# Patient Record
Sex: Male | Born: 1985 | Race: Black or African American | Hispanic: No | Marital: Single | Smoking: Never smoker
Health system: Southern US, Community
[De-identification: ages and names within clinical notes are randomized; demographics above are authoritative.]

## PROBLEM LIST (undated history)

## (undated) DIAGNOSIS — G4730 Sleep apnea, unspecified: Secondary | ICD-10-CM

## (undated) DIAGNOSIS — I10 Essential (primary) hypertension: Secondary | ICD-10-CM

---

## 2017-07-09 ENCOUNTER — Observation Stay
Admission: EM | Admit: 2017-07-09 | Discharge: 2017-07-09 | Disposition: A | Discharge status: Home / Self Care | Payer: Self-pay | Attending: Internal Medicine | Admitting: Internal Medicine

## 2017-07-09 ENCOUNTER — Emergency Department: Payer: Self-pay

## 2017-07-09 DIAGNOSIS — R0602 Shortness of breath: Secondary | ICD-10-CM

## 2017-07-09 DIAGNOSIS — I161 Hypertensive emergency: Secondary | ICD-10-CM

## 2017-07-09 DIAGNOSIS — S37009A Unspecified injury of unspecified kidney, initial encounter: Secondary | ICD-10-CM | POA: Insufficient documentation

## 2017-07-09 DIAGNOSIS — I509 Heart failure, unspecified: Secondary | ICD-10-CM | POA: Diagnosis present

## 2017-07-09 DIAGNOSIS — Z6841 Body Mass Index (BMI) 40.0 and over, adult: Secondary | ICD-10-CM | POA: Insufficient documentation

## 2017-07-09 DIAGNOSIS — I5031 Acute diastolic (congestive) heart failure: Secondary | ICD-10-CM | POA: Insufficient documentation

## 2017-07-09 DIAGNOSIS — R778 Other specified abnormalities of plasma proteins: Secondary | ICD-10-CM

## 2017-07-09 DIAGNOSIS — I110 Hypertensive heart disease with heart failure: Principal | ICD-10-CM | POA: Insufficient documentation

## 2017-07-09 DIAGNOSIS — N179 Acute kidney failure, unspecified: Secondary | ICD-10-CM

## 2017-07-09 DIAGNOSIS — I10 Essential (primary) hypertension: Secondary | ICD-10-CM | POA: Diagnosis present

## 2017-07-09 DIAGNOSIS — G4733 Obstructive sleep apnea (adult) (pediatric): Secondary | ICD-10-CM | POA: Diagnosis present

## 2017-07-09 DIAGNOSIS — I160 Hypertensive urgency: Secondary | ICD-10-CM | POA: Diagnosis present

## 2017-07-09 HISTORY — DX: Sleep apnea, unspecified: G47.30

## 2017-07-09 HISTORY — DX: Essential (primary) hypertension: I10

## 2017-07-09 LAB — B-TYPE NATRIURETIC PEPTIDE: B-Natriuretic Peptide: 679.2 pg/mL — ABNORMAL HIGH (ref 0.0–100.0)

## 2017-07-09 LAB — TROPONIN I
Troponin I: 0.03 ng/mL — ABNORMAL HIGH (ref 0.00–0.02)
Troponin I: 0.03 ng/mL — ABNORMAL HIGH (ref 0.00–0.02)
Troponin I: 0.02 ng/mL (ref 0.00–0.02)

## 2017-07-09 LAB — COMPREHENSIVE METABOLIC PANEL
ALT: 32 U/L (ref 0–55)
AST (SGOT): 46 U/L — ABNORMAL HIGH (ref 10–42)
Albumin/Globulin Ratio: 0.89 Ratio (ref 0.80–2.00)
Albumin: 3.4 gm/dL — ABNORMAL LOW (ref 3.5–5.0)
Alkaline Phosphatase: 89 U/L (ref 40–145)
Anion Gap: 14.4 mMol/L (ref 7.0–18.0)
BUN / Creatinine Ratio: 9.8 Ratio — ABNORMAL LOW (ref 10.0–30.0)
BUN: 14 mg/dL (ref 7–22)
Bilirubin, Total: 0.9 mg/dL (ref 0.1–1.2)
CO2: 26.3 mMol/L (ref 20.0–30.0)
Calcium: 8.7 mg/dL (ref 8.5–10.5)
Chloride: 106 mMol/L (ref 98–110)
Creatinine: 1.43 mg/dL — ABNORMAL HIGH (ref 0.80–1.30)
EGFR: 73 mL/min/{1.73_m2} (ref 60–150)
Globulin: 3.9 gm/dL (ref 2.0–4.0)
Glucose: 98 mg/dL (ref 71–99)
Osmolality Calc: 285 mOsm/kg (ref 275–300)
Potassium: 3.5 mMol/L (ref 3.5–5.3)
Protein, Total: 7.3 gm/dL (ref 6.0–8.3)
Sodium: 143 mMol/L (ref 136–147)

## 2017-07-09 LAB — CBC AND DIFFERENTIAL
Basophils %: 0.9 % (ref 0.0–3.0)
Basophils Absolute: 0.1 10*3/uL (ref 0.0–0.3)
Eosinophils %: 2.9 % (ref 0.0–7.0)
Eosinophils Absolute: 0.3 10*3/uL (ref 0.0–0.8)
Hematocrit: 37.9 % — ABNORMAL LOW (ref 39.0–52.5)
Hemoglobin: 12.3 gm/dL — ABNORMAL LOW (ref 13.0–17.5)
Lymphocytes Absolute: 4.3 10*3/uL (ref 0.6–5.1)
Lymphocytes: 37.1 % (ref 15.0–46.0)
MCH: 26 pg — ABNORMAL LOW (ref 28–35)
MCHC: 32 gm/dL (ref 31–36)
MCV: 79 fL — ABNORMAL LOW (ref 80–100)
MPV: 6.4 fL (ref 6.0–10.0)
Monocytes Absolute: 0.6 10*3/uL (ref 0.1–1.7)
Monocytes: 4.9 % (ref 3.0–15.0)
Neutrophils %: 54.2 % (ref 42.0–78.0)
Neutrophils Absolute: 6.3 10*3/uL (ref 1.7–8.6)
PLT CT: 330 10*3/uL (ref 130–440)
RBC: 4.78 10*6/uL (ref 4.00–5.70)
RDW: 14.6 % — ABNORMAL HIGH (ref 10.5–14.5)
WBC: 11.6 10*3/uL — ABNORMAL HIGH (ref 4.0–11.0)

## 2017-07-09 LAB — PROCALCITONIN: Procalcitonin: 0.03 ng/mL (ref 0.00–0.24)

## 2017-07-09 LAB — D-DIMER, QUANTITATIVE: D-Dimer: 0.37 mg/L FEU (ref 0.19–0.52)

## 2017-07-09 MED ORDER — FUROSEMIDE 10 MG/ML IJ SOLN
40.00 mg | Freq: Once | INTRAMUSCULAR | Status: AC
Start: 2017-07-09 — End: 2017-07-09
  Administered 2017-07-09: 06:00:00 40 mg via INTRAVENOUS

## 2017-07-09 MED ORDER — FUROSEMIDE 10 MG/ML IJ SOLN
INTRAMUSCULAR | Status: AC
Start: 2017-07-09 — End: ?
  Filled 2017-07-09: qty 4

## 2017-07-09 MED ORDER — POTASSIUM CHLORIDE CRYS ER 20 MEQ PO TBCR
20.00 meq | EXTENDED_RELEASE_TABLET | Freq: Two times a day (BID) | ORAL | 1 refills | Status: AC
Start: 2017-07-09 — End: ?

## 2017-07-09 MED ORDER — LABETALOL HCL 5 MG/ML IV SOLN
INTRAVENOUS | Status: AC
Start: 2017-07-09 — End: ?
  Filled 2017-07-09: qty 4

## 2017-07-09 MED ORDER — LABETALOL HCL 5 MG/ML IV SOLN
20.00 mg | Freq: Once | INTRAVENOUS | Status: AC
Start: 2017-07-09 — End: 2017-07-09
  Administered 2017-07-09: 02:00:00 20 mg via INTRAVENOUS

## 2017-07-09 MED ORDER — HEPARIN SODIUM (PORCINE) PF 5000 UNIT/0.5ML IJ SOLN
5000.00 [IU] | Freq: Three times a day (TID) | INTRAMUSCULAR | Status: DC
Start: 2017-07-09 — End: 2017-07-09
  Administered 2017-07-09 (×2): 5000 [IU] via SUBCUTANEOUS
  Filled 2017-07-09 (×5): qty 0.5

## 2017-07-09 MED ORDER — SENNOSIDES-DOCUSATE SODIUM 8.6-50 MG PO TABS
2.00 | ORAL_TABLET | Freq: Every evening | ORAL | Status: DC
Start: 2017-07-09 — End: 2017-07-09
  Filled 2017-07-09: qty 2

## 2017-07-09 MED ORDER — FUROSEMIDE 10 MG/ML IJ SOLN
60.00 mg | Freq: Two times a day (BID) | INTRAMUSCULAR | Status: DC
Start: 2017-07-09 — End: 2017-07-09
  Administered 2017-07-09 (×2): 60 mg via INTRAVENOUS
  Filled 2017-07-09 (×3): qty 6

## 2017-07-09 MED ORDER — LISINOPRIL 20 MG PO TABS
20.00 mg | ORAL_TABLET | Freq: Every day | ORAL | Status: DC
Start: 2017-07-09 — End: 2017-07-09
  Administered 2017-07-09: 08:00:00 20 mg via ORAL
  Filled 2017-07-09 (×2): qty 1

## 2017-07-09 MED ORDER — SODIUM CHLORIDE 0.9 % IJ SOLN
3.00 mL | Freq: Three times a day (TID) | INTRAMUSCULAR | Status: DC
Start: 2017-07-09 — End: 2017-07-09
  Administered 2017-07-09: 06:00:00 3 mL via INTRAVENOUS

## 2017-07-09 MED ORDER — LABETALOL HCL 5 MG/ML IV SOLN
INTRAVENOUS | Status: AC
Start: 2017-07-09 — End: ?
  Filled 2017-07-09: qty 8

## 2017-07-09 MED ORDER — NALOXONE HCL 0.4 MG/ML IJ SOLN (WRAP)
0.40 mg | INTRAMUSCULAR | Status: DC | PRN
Start: 2017-07-09 — End: 2017-07-09

## 2017-07-09 MED ORDER — FUROSEMIDE 20 MG PO TABS
20.00 mg | ORAL_TABLET | Freq: Every day | ORAL | 0 refills | Status: AC
Start: 2017-07-09 — End: ?

## 2017-07-09 MED ORDER — VH POTASSIUM CHLORIDE CRYS ER 20 MEQ PO TBCR (WRAP)
20.00 meq | EXTENDED_RELEASE_TABLET | Freq: Two times a day (BID) | ORAL | Status: DC
Start: 2017-07-09 — End: 2017-07-09
  Administered 2017-07-09 (×2): 20 meq via ORAL
  Filled 2017-07-09 (×3): qty 1

## 2017-07-09 MED ORDER — FUROSEMIDE 10 MG/ML IJ SOLN
40.00 mg | Freq: Two times a day (BID) | INTRAMUSCULAR | Status: DC
Start: 2017-07-09 — End: 2017-07-09
  Filled 2017-07-09: qty 4

## 2017-07-09 MED ORDER — HYDRALAZINE HCL 20 MG/ML IJ SOLN
INTRAMUSCULAR | Status: AC
Start: 2017-07-09 — End: ?
  Filled 2017-07-09: qty 1

## 2017-07-09 MED ORDER — LABETALOL HCL 5 MG/ML IV SOLN
40.00 mg | Freq: Once | INTRAVENOUS | Status: AC
Start: 2017-07-09 — End: 2017-07-09
  Administered 2017-07-09: 03:00:00 40 mg via INTRAVENOUS

## 2017-07-09 MED ORDER — HYDRALAZINE HCL 20 MG/ML IJ SOLN
10.00 mg | Freq: Once | INTRAMUSCULAR | Status: DC
Start: 2017-07-09 — End: 2017-07-09

## 2017-07-09 MED ORDER — CLONIDINE HCL 0.1 MG PO TABS
0.20 mg | ORAL_TABLET | Freq: Once | ORAL | Status: AC
Start: 2017-07-09 — End: 2017-07-09
  Administered 2017-07-09: 02:00:00 0.2 mg via ORAL

## 2017-07-09 MED ORDER — CLONIDINE HCL 0.1 MG PO TABS
ORAL_TABLET | ORAL | Status: AC
Start: 2017-07-09 — End: ?
  Filled 2017-07-09: qty 2

## 2017-07-09 MED ORDER — DOCUSATE SODIUM 100 MG PO CAPS
100.00 mg | ORAL_CAPSULE | Freq: Every day | ORAL | Status: DC
Start: 2017-07-10 — End: 2017-07-09
  Filled 2017-07-09: qty 1

## 2017-07-09 MED ORDER — LISINOPRIL 20 MG PO TABS
20.00 mg | ORAL_TABLET | Freq: Every day | ORAL | 1 refills | Status: AC
Start: 2017-07-09 — End: ?

## 2017-07-09 NOTE — Progress Notes (Signed)
Received report from RN. Patient arrived via wheelchair to [facility] room #217.

## 2017-07-09 NOTE — UM Notes (Signed)
[facility] Utilization Management Review Sheet    Facility :  [facility]    NAME: Kristopher Young  MR#: [id number]    CSN#: [id number]    ROOM: 217/217-A AGE: 32 y.o.    ADMIT DATE AND TIME: 07/09/2017 12:59 AM   MD admit order 07/09/2017 @ 0537      PATIENT CLASS: Observation     ATTENDING PHYSICIAN: Charolotte Eke, MD  PAYOR:Payor: /       AUTH #:     DIAGNOSIS:     ICD-10-CM    1. Acute diastolic congestive heart failure I50.31    2. Hypertensive emergency I16.1    3. Elevated troponin R74.8    4. Acute renal failure, unspecified acute renal failure type N17.9        HISTORY:   Past Medical History:   Diagnosis Date   . Hypertension    . Sleep apnea        DATE OF REVIEW: 07/09/2017    VITALS: BP 142/89   Pulse 84   Temp 98.9 F (37.2 C) (Tympanic)   Resp 18   Ht 1.702 m (5\' 7" )   Wt 139.7 kg (307 lb 15.7 oz)   SpO2 96%   BMI 48.24 kg/m     Active Hospital Problems    Diagnosis   . CHF (congestive heart failure)   . OSA (obstructive sleep apnea)   . Hypertensive urgency   . HTN (hypertension)   . Obesity, morbid       07/09/17 0106 -- 97.6 F (36.4 C) --  111 95 % --  24  195/138 L. arm, sitting, large adult cuff --     07/09/17 0220 -- -- --  101 95 % --  23  190/139 --     pt. states that yesterday he noticed that he was having some swelling/fluid retention in his belly and was having trouble getting comfortable when he was lying down and also SOB, pt. has had to be hospitalized and gotten IV Lasix before, has been taking of fluid pills since last July because he couldn't keep is potassium up     EKG: 01:11 Sinus tach rate 105, LAE, NSST changes, Abnormal ECG-interpreted by WJB    Labs WBC 11.6. , Crt 1.43.  Bnp 679.2. Trop 0.03         XR Chest 2 Views (Final result)   Result time 07/09/17 02:10:18   Final result by Royetta Car, MD (07/09/17 02:10:18)                Impression:      1. Bilateral pulmonary opacities favoring multifocal pneumonia.  2. Cardiac silhouette at the upper  limits of normal in size.                  ED tx: ML. Monitor. Labetalol 20mg  IV. Catapres 0.2mg  po. Repeat Labetalol 40mg  IV. Apresoling 10mg  IV. Lasix 40mg  IV.          Jacqulynn Cadet, MD Physician Addendum Medicine  H&P   Date of Service: 07/09/2017 5:38 AM Creation Time: 07/09/2017 5:38 AM        Assessment and Plan:  1.  DOE and orthopnea more likely 2/2 CHF (congestive heart failure) precipitated by hypertensive urgency. Although CXR is read as b/l multifocal pneumonia, patient doesn't have symptoms c/w pneumonia.  no hypoxia  -Patient is from [place], he has a cardiologist there  -Troponin 0.03 x2, EKG without acute st-t changes  -Admit on obs to Med/surg on Tele  -Continue IV Lasix, monitor clinical progress  -I & O, daily weights, fluid restriction, diet Na 2grams  -patient states he was taken off lasix last year for hypokalemia-will increase supplement from qd to bid and monitor chem 8  -F/u Procalcitonin, repeat cxr in AM    2. Hypertensive urgency, h/o HTN  -BP came down after Labetalol totalling 60 mg iv and Clonidine 0.2  -On Lisinopril 10 mg at home, will increase to 20 mg for now and monitor BP    3.  Renal Failure-? Acute vs chronic, no prior data, patient from out of town  -Monitor chem 8 on IV lasix    3.  OSA (obstructive sleep apnea)  -Hasn't been using his CPAP for at least 6 days  -On CPAP in the ED-continue QHS    4.  Obesity, morbid  -Weight loss recommended-f/u with pcp         Observation  Trop, Bmp, Procalcitonin. Cxr am. SCD. ML. Telemetry. Cpap. Strict I&O.  Daily weight. Consult Nutrition. Lasix 60mg  IV 2x day.         07/09/2017   Discharge.      Lowella Dell RN, CCM  Case Management/UR  [facility]  [address]  [place], [place] [address]    [phone] Direct Line  [phone] Fax

## 2017-07-09 NOTE — ED Notes (Addendum)
I received a bed for pt. To be observed on msu 217 for chf and I spoke with supervisor chance michael

## 2017-07-09 NOTE — Respiratory Progress Note (Signed)
Called to Emergency Room for Bedside CPAP administration. Patient has CPAP at home 13 cmh20 nasal mask. Patient is ER Holding patient at this time. Patient placed on 13 cmh20 with nasal mask tolerating well o2 sat 100% HR 80 RR 22 resting comfortably.

## 2017-07-09 NOTE — Progress Notes (Signed)
Voicemail left for dietary consult via telephone.

## 2017-07-09 NOTE — Plan of Care (Signed)
Problem: Hemodynamic Status: Cardiac  Goal: Stable vital signs and fluid balance  Outcome: Not Progressing   07/09/17 1017   Goal/Interventions addressed this shift   Stable vital signs and fluid balance Monitor/assess vital signs and telemetry per unit protocol;Weigh on admission and record weight daily;Assess signs and symptoms associated with cardiac rhythm changes;Monitor intake/output per unit protocol and/or LIP order;Monitor lab values;Monitor for leg swelling/edema and report to LIP if abnormal   AS EVIDENCED BY ORTHOPNEA, AND CONTINUED BLE EDEMA

## 2017-07-09 NOTE — Consults (Signed)
Attempted to see pt a couple times, but he was sleeping. I will try again on 5/8 to complete consult.

## 2017-07-09 NOTE — Progress Notes (Signed)
Written discharge instructions reviewed with patient, all questions answered, verbalized understanding. Patient left department ambulatory, declined wheelchair

## 2017-07-09 NOTE — Progress Notes (Signed)
MD notified patient has not yet had dietary consultation. MD order to continue with discharge

## 2017-07-09 NOTE — ED Notes (Signed)
I called supervisor chance michael for a bed for pt. To be observed on msu

## 2017-07-09 NOTE — ED Notes (Addendum)
I called registration to give them the information on pt. Being observed on msu 217 for chf and I spoke with shelley

## 2017-07-09 NOTE — ED Notes (Signed)
Pt. Taken to [facility] via wheelchair, Dr. Berneice Heinrich advised that he did not need telemetry for transport

## 2017-07-09 NOTE — Progress Notes (Signed)
Patient alert and oriented, no s/s of acute distress noted; patient denies any chest pain; radial pulse palpable but thready; MD notified, cardiopulmonary called for EKG; will monitor.

## 2017-07-09 NOTE — ED Notes (Signed)
Manual BP 195/132

## 2017-07-09 NOTE — Progress Notes (Signed)
Pt's heart rate elevated to the 200's.  Primary RN, Lanora Manis, notified and checked on pt.

## 2017-07-09 NOTE — Progress Notes (Signed)
Report given to RN

## 2017-07-09 NOTE — ED Notes (Signed)
Pt. Eating boxed lunch.

## 2017-07-09 NOTE — ED Notes (Signed)
Hospitalist at bedside 

## 2017-07-09 NOTE — ED Provider Notes (Signed)
EMERGENCY DEPARTMENT HISTORY AND PHYSICAL EXAM    Date: 07/09/17  Patient Name: Kristopher Young,Kristopher Young  Attending Physician: Jacqulynn Cadet, MD  Patient DOB:  17-Nov-1985  MRN:  [id number]  Room:  217/217-A      History of Presenting Illness     Chief Complaint: Shortness of Breath, swelling     HPI/ROS is limited by: none  HPI/ROS given by: patient       Context: Kristopher Young is a 32 y.o. male who presents with a feeling of shortness of breath. This is especially bad when he lays down. He denies chest pain. He feels like there is swelling in his belly.   Location: generalized  Severity: unknown  Duration: 1 day   Quality: vague   Associated Signs/ Symptoms: shortness of breath, orthopnea  Exacerbation/Mitigating factors: laying down makes him feel worse       PMD: Pcp, None, MD    Past Medical History     Past Medical History:   Diagnosis Date   . Hypertension    . Sleep apnea        Past Surgical History     History reviewed. No pertinent surgical history.    Family History     History reviewed. No pertinent family history.    Social History     Social History     Social History   . Marital status: Single     Spouse name: N/A   . Number of children: N/A   . Years of education: N/A     Social History Main Topics   . Smoking status: Never Smoker   . Smokeless tobacco: Never Used   . Alcohol use No      Comment: rarely   . Drug use: No   . Sexual activity: Not on file     Other Topics Concern   . Not on file     Social History Narrative   . No narrative on file       Allergies     No Known Allergies    Home Medications     Prior to Admission medications    Medication Sig Start Date End Date Taking? Authorizing Provider   lisinopril (PRINIVIL,ZESTRIL) 10 MG tablet Take 10 mg by mouth daily   Yes [provider]       ED Medications Administered     ED Medication Orders     Start Ordered     Status Ordering Provider    07/09/17 0520 07/09/17 0519  furosemide (LASIX) injection 40 mg  Once in ED     Route: Intravenous   Ordered Dose: 40 mg     Last MAR action:  Given Minette Brine II    07/09/17 0355 07/09/17 0354  hydrALAZINE (APRESOLINE) injection 10 mg  Once     Route: Intravenous  Ordered Dose: 10 mg     Last MAR action:  Not Given Minette Brine II    07/09/17 0306 07/09/17 0305  labetalol (NORMODYNE,TRANDATE) injection 40 mg  Once in ED     Route: Intravenous  Ordered Dose: 40 mg     Last MAR action:  Given Itzae Miralles J II    07/09/17 0225 07/09/17 0224  cloNIDine (CATAPRES) tablet 0.2 mg  Once in ED     Route: Oral  Ordered Dose: 0.2 mg     Last MAR action:  Given Channah Godeaux J II    07/09/17 0215 07/09/17 0214  labetalol (NORMODYNE,TRANDATE) injection 20 mg  Once in ED     Route: Intravenous  Ordered Dose: 20 mg     Last MAR action:  Given Makaylee Spielberg J II            Review of Systems     Review of Systems   Constitutional: Negative for chills and fever.   HENT: Negative for ear pain and sore throat.    Eyes: Negative for discharge and redness.   Respiratory: Positive for shortness of breath. Negative for cough.    Cardiovascular: Negative for chest pain and palpitations.   Gastrointestinal: Negative for abdominal pain, nausea and vomiting.   Genitourinary: Negative for dysuria and urgency.   Musculoskeletal: Negative for neck pain.   Skin: Negative for rash.   Neurological: Negative for headaches.   Endo/Heme/Allergies: Does not bruise/bleed easily.   All other systems reviewed and are negative.        Physical Exam     Physical Exam   Constitutional: He is oriented to person, place, and time. He appears well-developed and well-nourished.   HENT:   Head: Normocephalic and atraumatic.   Mouth/Throat: Oropharynx is clear and moist.   Eyes: Pupils are equal, round, and reactive to light. EOM are normal.   Neck: Normal range of motion. Neck supple.   Cardiovascular: Normal rate, regular rhythm, normal heart sounds and intact distal pulses.    Pulmonary/Chest: Effort normal and breath sounds normal.   Abdominal: Soft.  Bowel sounds are normal.   Musculoskeletal: Normal range of motion.   Neurological: He is alert and oriented to person, place, and time.   Skin: Skin is warm and dry.   Nursing note and vitals reviewed.      Procedures     N/A    Diagnostic Study Results     EKG: 01:11 Sinus tach rate 105, LAE, NSST changes, Abnormal ECG-interpreted by WJB    Monitor: N/A    Laboratory results reviewed by ED provider:    Results     Procedure Component Value Units Date/Time    Procalcitonin [[id number]] Collected:  07/09/17 0435    Specimen:  Plasma Updated:  07/09/17 0519    Narrative:       Please note reference range change effective 06/10/2017    Troponin I (Stat) [[id number]]  (Abnormal) Collected:  07/09/17 0435    Specimen:  Plasma Updated:  07/09/17 0502     Troponin I 0.03 (H) ng/mL     B-type Natriuretic Peptide [[id number]]  (Abnormal) Collected:  07/09/17 0120    Specimen:  Blood Updated:  07/09/17 0153     B-Natriuretic Peptide 679.2 (H) pg/mL     Troponin I (Stat) [[id number]]  (Abnormal) Collected:  07/09/17 0120    Specimen:  Plasma Updated:  07/09/17 0146     Troponin I 0.03 (H) ng/mL     Comprehensive metabolic panel [[id number]]  (Abnormal) Collected:  07/09/17 0120    Specimen:  Plasma Updated:  07/09/17 0145     Sodium 143 mMol/L      Potassium 3.5 mMol/L      Chloride 106 mMol/L      CO2 26.3 mMol/L      Calcium 8.7 mg/dL      Glucose 98 mg/dL      Creatinine 2.44 (H) mg/dL      BUN 14 mg/dL      Protein, Total 7.3 gm/dL      Albumin 3.4 (L) gm/dL      Alkaline Phosphatase 89 U/L  ALT 32 U/L      AST (SGOT) 46 (H) U/L      Bilirubin, Total 0.9 mg/dL      Albumin/Globulin Ratio 0.89 Ratio      Anion Gap 14.4 mMol/L      BUN/Creatinine Ratio 9.8 (L) Ratio      EGFR 73 mL/min/1.41m2      Osmolality Calc 285 mOsm/kg      Globulin 3.9 gm/dL     D-dimer, quantitative [[id number]] Collected:  07/09/17 0120    Specimen:  Blood Updated:  07/09/17 0142     D-Dimer 0.37 mg/L FEU     CBC and differential [[id number]]   (Abnormal) Collected:  07/09/17 0120    Specimen:  Blood from Blood Updated:  07/09/17 0139     WBC 11.6 (H) K/cmm      RBC 4.78 M/cmm      Hemoglobin 12.3 (L) gm/dL      Hematocrit 78.2 (L) %      MCV 79 (L) fL      MCH 26 (L) pg      MCHC 32 gm/dL      RDW 95.6 (H) %      PLT CT 330 K/cmm      MPV 6.4 fL      NEUTROPHIL % 54.2 %      Lymphocytes 37.1 %      Monocytes 4.9 %      Eosinophils % 2.9 %      Basophils % 0.9 %      Neutrophils Absolute 6.3 K/cmm      Lymphocytes Absolute 4.3 K/cmm      Monocytes Absolute 0.6 K/cmm      Eosinophils Absolute 0.3 K/cmm      BASO Absolute 0.1 K/cmm           Radiologic study results reviewed by ED provider:    Radiology Results (24 Hour)     Procedure Component Value Units Date/Time    XR Chest 2 Views [[id number]] Collected:  07/09/17 0209    Order Status:  Completed Updated:  07/09/17 0211    Narrative:       HISTORY:Productive cough for 3 days.    COMPARISON:  None.     TECHNIQUE: XR CHEST 2 VIEWS     FINDINGS:    Lines and tubes: None    Lungs and hila: There are scattered hazy opacities in the lungs bilaterally. There is no pleural effusion. There is no pneumothorax.    Heart and Mediastinum: The cardiac silhouette is at the upper limits of normal in size.     Bones and soft tissues: No acute abnormality.    Upper abdomen: Normal      Impression:         1. Bilateral pulmonary opacities favoring multifocal pneumonia.  2. Cardiac silhouette at the upper limits of normal in size.            ReadingStation:[id number]      .    Rendering Provider: Minette Brine II, MD      VS     Patient Vitals for the past 24 hrs:   BP Temp Temp src Pulse Resp SpO2 Height Weight   07/09/17 0610 (!) 152/103 98.1 F (36.7 C) Tympanic 82 20 98 % 1.702 m 139.7 kg   07/09/17 0558 (!) 148/104 - - 79 20 97 % - -   07/09/17 0543 (!) 148/102 - - 81 (!) 28 96 % - -  07/09/17 0528 (!) 149/110 - - 80 18 97 % - -   07/09/17 0512 (!) 134/93 - - 79 (!) 23 95 % - -   07/09/17 0458 (!) 133/93 - - 78  (!) 23 95 % - -   07/09/17 0443 130/89 - - 81 21 94 % - -   07/09/17 0437 (!) 138/95 - - 80 (!) 24 94 % - -   07/09/17 0435 - - - 76 (!) 23 95 % - -   07/09/17 0427 135/90 - - 79 (!) 25 94 % - -   07/09/17 0414 (!) 133/107 - - 80 (!) 23 - - -   07/09/17 0358 (!) 165/116 - - 79 20 99 % - -   07/09/17 0353 - - - 79 18 99 % - -   07/09/17 0342 (!) 150/112 - - 81 21 95 % - -   07/09/17 0327 - - - 81 (!) 26 97 % - -   07/09/17 0325 (!) 156/111 - - 80 - - - -   07/09/17 0323 (!) 164/115 - - 81 (!) 32 99 % - -   07/09/17 0318 (!) 173/127 - - 83 17 93 % - -   07/09/17 0314 (!) 179/131 - - 80 - 95 % - -   07/09/17 0259 (!) 176/132 - - 85 21 92 % - -   07/09/17 0245 (!) 172/128 - - 83 (!) 30 94 % - -   07/09/17 0230 (!) 168/122 - - 85 21 93 % - -   07/09/17 0229 (!) 168/122 - - 85 - - - -   07/09/17 0225 (!) 173/123 - - 87 (!) 26 95 % - -   07/09/17 0220 (!) 190/139 - - (!) 101 (!) 23 95 % - -   07/09/17 0141 (!) 183/132 - - 98 (!) 28 95 % - -   07/09/17 0133 - - - 97 19 97 % - -   07/09/17 0106 (!) 195/138 97.6 F (36.4 C) - (!) 111 (!) 24 95 % 1.702 m 149.7 kg         Clinical Course in Emergency Department     Consults: 02:20 Discussed pt's care with Dr. Berneice Heinrich and she will see him after his repeat Troponin is back.     Reevaluation: 04:31 CPAP seems to have been the best agent to improve the pt's blood pressure.     MDM: CHF, Renal Failure,   Critical Care Time (not including procedures): 85 minutes.   Due to the high risk of critical illness or multi-organ failure at initial presentation and/or during ED course.   System(s) at risk for compromise: circulatory, respiratory and CNS  Critical Diagnosis:   1. Acute diastolic congestive heart failure    2. Hypertensive emergency    3. Elevated troponin    4. Acute renal failure, unspecified acute renal failure type      The patient was Hypoxic: no   This does not including time spent performing other reported procedures or services.  Critical care time involved full attention  to the patient's condition and included:   Review of nursing notes and/or old charts - Yes   Documentation time - Yes   Care, transfer of care, and discharge plans - Yes   Obtaining necessary history from family, EMS, nursing home staff and/or treating physicians - Yes   Review of medications, allergies, and vital signs - Yes   Consultant collaboration on findings and  treatment options - Yes   Ordering, interpreting, and reviewing diagnostic studies/tab tests - Yes 85    Diagnosis and Disposition     Clinical Impression  1. Acute diastolic congestive heart failure    2. Hypertensive emergency    3. Elevated troponin    4. Acute renal failure, unspecified acute renal failure type        Disposition  ED Disposition     ED Disposition Condition Date/Time Comment    Observation  Tue Jul 09, 2017  5:38 AM Admitting Physician: Jacqulynn Cadet [[id number]]   Diagnosis: CHF (congestive heart failure) [366440]   Estimated Length of Stay: < 2 midnights   Tentative Discharge Plan?: Home or Self Care [1]   Patient Class: Observation [104]            Vital signs were reviewed at the time of disposition.  Patient Vitals for the past 24 hrs:   BP Temp Temp src Pulse Resp SpO2 Height Weight   07/09/17 0610 (!) 152/103 98.1 F (36.7 C) Tympanic 82 20 98 % 1.702 m 139.7 kg   07/09/17 0558 (!) 148/104 - - 79 20 97 % - -   07/09/17 0543 (!) 148/102 - - 81 (!) 28 96 % - -   07/09/17 0528 (!) 149/110 - - 80 18 97 % - -   07/09/17 0512 (!) 134/93 - - 79 (!) 23 95 % - -   07/09/17 0458 (!) 133/93 - - 78 (!) 23 95 % - -   07/09/17 0443 130/89 - - 81 21 94 % - -   07/09/17 0437 (!) 138/95 - - 80 (!) 24 94 % - -   07/09/17 0435 - - - 76 (!) 23 95 % - -   07/09/17 0427 135/90 - - 79 (!) 25 94 % - -   07/09/17 0414 (!) 133/107 - - 80 (!) 23 - - -   07/09/17 0358 (!) 165/116 - - 79 20 99 % - -   07/09/17 0353 - - - 79 18 99 % - -   07/09/17 0342 (!) 150/112 - - 81 21 95 % - -   07/09/17 0327 - - - 81 (!) 26 97 % - -   07/09/17 0325 (!) 156/111 - - 80  - - - -   07/09/17 0323 (!) 164/115 - - 81 (!) 32 99 % - -   07/09/17 0318 (!) 173/127 - - 83 17 93 % - -   07/09/17 0314 (!) 179/131 - - 80 - 95 % - -   07/09/17 0259 (!) 176/132 - - 85 21 92 % - -   07/09/17 0245 (!) 172/128 - - 83 (!) 30 94 % - -   07/09/17 0230 (!) 168/122 - - 85 21 93 % - -   07/09/17 0229 (!) 168/122 - - 85 - - - -   07/09/17 0225 (!) 173/123 - - 87 (!) 26 95 % - -   07/09/17 0220 (!) 190/139 - - (!) 101 (!) 23 95 % - -   07/09/17 0141 (!) 183/132 - - 98 (!) 28 95 % - -   07/09/17 0133 - - - 97 19 97 % - -   07/09/17 0106 (!) 195/138 97.6 F (36.4 C) - (!) 111 (!) 24 95 % 1.702 m 149.7 kg          Prescriptions  Current Discharge Medication List  SIGNED BY: Minette Brine II, MD        This chart was generated by an EMR and may contain errors, including typographical, or omissions not intended by the user.               Alger Simons, MD  07/09/17 [phone]

## 2017-07-09 NOTE — Progress Notes (Signed)
Assessment initiated, see documentation; no s/s of acute distress noted; IV site patent, flushes well with no s/s of infiltration at site; denies any needs at this time; HOB elevated, side rails up x 2 and bed in locked, lowest position; call bell in reach, will monitor

## 2017-07-09 NOTE — Progress Notes (Signed)
07/09/17 1704   Exercise Saturation    $ Pulse Oximetry Type Performed Multiple (Walking)   Procedure status Completed   Room air sat at rest 97   Is room air sat at rest > 88% Yes   Walking room air sat 92   Is walking RA sat < or equal to 88% No   Patient Tolerance Tolerated well without incident   Adverse Reactions None

## 2017-07-09 NOTE — Progress Notes (Signed)
07/09/17 1823   Patient Type   Within 30 Days of Previous Admission? No   Healthcare Decisions   Interviewed: Patient   Orientation/Decision Making Abilities of Patient Alert and Oriented x3, able to make decisions   Prior to admission   Prior level of function Independent with ADLs   Type of Residence Private residence   Living Arrangements Alone   Dressing Independent   Grooming Independent   Feeding Independent   Bathing Independent   Toileting Independent   DME Currently at Home Other (Comment)  (Cpap)   Discharge Planning   Patient expects to be discharged to: Return to home.    Important Message from Medicare Notice   Patient received 1st IMM Letter? n/a       INITIAL ASSESSMENT  Case Management / Social Work      Estimated D/C Date: 07/10/2017      PCP/Last visit: Unsure, but voices he will make appointment w/Dr. Laural Benes upon his return to home.         Financials and Insurance:  Unfunded. Perla from finance spoke to pt about FIS.       DME's/Supplier: Cpap/portable, is adaptable to truck outlet.  Compliant w/using.      Inpatient Plan of Care:   DOE and orthopnea more likely 2/2 CHF (congestive heart failure) precipitated by hypertensive urgency.  Monitor labs. ML. IV lasix. Monitor weights. Education. Tele.       CM Interventions: Reviewed chart. Spoke w/pt at bedside. 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  He has called his brother they are working out his brother driving here. He is normally on the road 7-8 days, home 2 days. He does have a cooler he keeps snacks/drinks in. He eats a lot of fast food while on the road driving. He is agreeable to education from dietary. He is able to afford meds if needed. He denies any issues or assistance at this time.   CM will continue to monitor.       Lowella Dell RN, CCM  Case Management/UR  [facility]  [address]  [place], [place] [address]    [phone] Direct Line  [phone]  Fax          Transportation:      Barriers to discharge:        D/C Plan and Needs:        Lowella Dell [facility]  [facility]  Ph: [phone]  Fx: [phone]

## 2017-07-09 NOTE — ED Notes (Signed)
Pt. Resting comfortably wearing CPAP

## 2017-07-09 NOTE — Progress Notes (Signed)
Rhythm strips saved.

## 2017-07-09 NOTE — Progress Notes (Signed)
[facility] Medical Records department called to obtain fax number in order for Authorization of Health Information sheet to be sent.  Spoke to [place].

## 2017-07-09 NOTE — ED Notes (Signed)
I put a copy of the rhythm strip on the chart

## 2017-07-09 NOTE — Discharge Summary (Addendum)
Medicine Discharge Summary - [facility]  [employer]   Patient Name: Kristopher Young,Kristopher Young   Attending Physician: Charolotte Eke, MD PCP: @PCP    Date of Admission: 07/09/2017 D/C Date: 07/09/2017   Discharge Diagnoses:   1. Congestive heart failure, systolic, acute on chronic  2. Hypertension, well above goal, probably with an element of noncompliance  3. Renal injury, unclear if acute or chronic  4. Obstructive sleep apnea  5. Morbid obesity       Hospital Course     Kristopher Young is a 32 y.o. male patient with h/o HTN, peripheral edema and probable CHF who presents with 3-4 days of DOE, orthopnea and "excess fluid in his belly".  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  Patient is a difficult historian.  He states that he was hospitalized last year in [place], [place] for similar symptoms, was started on Lasix but discontinued because of low potassium.  He reports he has been having SOB when he lays down at night and with exertion for the past 3-4 days.  He feels better when he sits up.  He also admits to having some dry cough only in the evenings from "allergies, air I breath in" but doesn't go on all night.  Denies chest pain/discomfort, leg edema, fever, chills, nausea, vomiting or abdominal pain.  He has not had any cough during the entire stay at [facility] ED.      Hospital course:  The patient was placed on observation status, in the medical surgical unit, on telemetry. No arrhythmias were noted (except for an unusual rhythm that was later determined to artifactual, from probable radiofrequency interference from either construction activities in other rooms on the unit, or the bluetooth headset he was using).    The patient was aggressively diuresed with furosemide 60 mg IV twice a day. The patient slept on BiPAP most of the morning. By the early afternoon, the patient was reporting resolution of the shortness of breath. Patient denies chest pain,  lightheadedness, dizziness or any other cardiovascular symptoms. Blood pressure is under much better control.    The patient is now extremely eager for discharge. Walking room air sat was obtained, in the high 90s, with ambulation. The patient feels extremely well "I could go to the gym", and "I could run a mile". He was eager to return home "you guys got all the fluid off".    It should be noted that the patient has a poor understanding of his medicine regimen. He was able to say that he was on lisinopril, probably 10 mg daily. Review of records from a prior hospitalization demonstrated that he at one point had been on Coreg and hydrochlorothiazide. It is unclear if he is still on these medicines.    By the afternoon of admission, the patient has exhausted any benefit from hospital admission. With the patient needs now is to return to his primary care physician and cardiologist, and get his medicine regimen straightened out.    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      Principal Problem:    CHF (congestive heart failure)  Active Problems:    OSA (obstructive sleep apnea)    Hypertensive urgency    HTN (hypertension)    Obesity, morbid  Resolved Problems:    * No resolved hospital problems. *     Pending Results and other significant studies: Upon  outpatient follow-up, the patient needs to go over his medicine list. Patient is unsure which medicines he is taking. Reassess volume status, blood pressure and electrolytes.     Discharge Instructions:        Disposition:  Discharged home  Diet: Heart healthy  Activity: No restrictions  Discharge Code Status: Full Code  Pcp, None, MD    Follow up  Follow up with primary physician or cardiologist IMMEDIATELY upon return home     Discharge Medications:                                                                      Discharge Medication List      Taking    furosemide 20 MG tablet  Dose:  20  mg  Commonly known as:  LASIX  Take 1 tablet (20 mg total) by mouth daily     lisinopril 20 MG tablet  Dose:  20 mg  What changed:   medication strength   how much to take  Commonly known as:  PRINIVIL,ZESTRIL  Take 1 tablet (20 mg total) by mouth daily     potassium chloride 20 MEQ tablet  Dose:  20 mEq  What changed:  when to take this  Commonly known as:  K-DUR,KLOR-CON  Take 1 tablet (20 mEq total) by mouth 2 (two) times daily          Please note increase in lisinopril dose, to 20 mg daily. Lasix 20 mg daily, and potassium has been increased to 20 mEq twice a day. Please note that this is based on an incomplete understanding of the patient's prior medicine regimen.     Discharge Day Exam (07/09/2017):   Blood pressure 142/89, pulse 84, temperature 98.9 F (37.2 C), temperature source Tympanic, resp. rate 18, height 1.702 m (5\' 7" ), weight 139.7 kg (307 lb 15.7 oz), SpO2 96 %.  Physical Exam  General: Patient is awake. In no acute distress.  HEENT: PERRL, EOMI, no conjunctival drainage, vision is intact.  Neck: supple, no thyromegaly.  Chest: CTA bilaterally. No rhonchi, no wheezing. No use of accessory muscles.  CVS: normal rate and regular rhythm no murmurs, without JVD.  Abdomen: soft, non-tender, no guarding or rigidity, with normal bowel sounds.  Extremities: Trace to 1+ pitting edema, pulses palpable, no calf swelling and gross no deformity.  Skin: Warm, dry, no rash and no worrisome lesions.  NEURO: no motor or sensory deficits.  Psychiatric: alert, interactive, appropriate, normal affect.   Recent Labs      Recent Labs  Lab 07/09/17  0120   WBC 11.6*   RBC 4.78   Hemoglobin 12.3*   Hematocrit 37.9*   MCV 79*   PLT CT 330           Recent Labs  Lab 07/09/17  0802 07/09/17  0435 07/09/17  0120   Troponin I 0.02 0.03* 0.03*     No results found for: [id number]    Recent Labs  Lab 07/09/17  0120   Glucose 98   Sodium 143   Potassium 3.5   Chloride 106   CO2 26.3   BUN 14   Creatinine 1.43*   EGFR 73    Calcium 8.7  Recent Labs  Lab 07/09/17  0120   Albumin 3.4*   Protein, Total 7.3   Bilirubin, Total 0.9   Alkaline Phosphatase 89   ALT 32   AST (SGOT) 46*      Allergies:      Patient has no known allergies.   Time spent on discharging the patient:  35 minutes   Xr Chest 2 Views    Result Date: 07/09/2017  1. Bilateral pulmonary opacities favoring multifocal pneumonia. 2. Cardiac silhouette at the upper limits of normal in size. ReadingStation:[id number]     Charolotte Eke, MD         07/09/17 6:01 PM   MRN: [id number]                                      CSN: [id number] DOB: November 04, 1985     Note: The chest x-ray noted above is most consistent with pulmonary edema, rather than pneumonia, based on normal pro-calcitonin. Follow-up physical exam was unremarkable, on discharge.

## 2017-07-09 NOTE — H&P (Addendum)
Medicine History & Physical - [facility]  [employer]   Patient Name: KristopherAlmando LOS: 0 days   Attending Physician: Alger Simons, MD PCP: @PCP       Assessment and Plan:                                                                   1.  DOE and orthopnea more likely 2/2 CHF (congestive heart failure) precipitated by hypertensive urgency. Although CXR is read as b/l multifocal pneumonia, patient doesn't have symptoms c/w pneumonia.  no hypoxia  -Patient is from [place], he has a cardiologist there  -Troponin 0.03 x2, EKG without acute st-t changes  -Admit on obs to Med/surg on Tele  -Continue IV Lasix, monitor clinical progress  -I & O, daily weights, fluid restriction, diet Na 2grams  -patient states he was taken off lasix last year for hypokalemia-will increase supplement from qd to bid and monitor chem 8  -F/u Procalcitonin, repeat cxr in AM    2. Hypertensive urgency, h/o HTN  -BP came down after Labetalol totalling 60 mg iv and Clonidine 0.2  -On Lisinopril 10 mg at home, will increase to 20 mg for now and monitor BP    3.  Renal Failure-? Acute vs chronic, no prior data, patient from out of town  -Monitor chem 8 on IV lasix    3.  OSA (obstructive sleep apnea)  -Hasn't been using his CPAP for at least 6 days  -On CPAP in the ED-continue QHS    4.  Obesity, morbid  -Weight loss recommended-f/u with pcp     Subjective   History of Presenting Illness                                CC: SOB and abdominal swelling  Kristopher Young is a 32 y.o. male patient with h/o HTN, peripheral edema and probable CHF who presents with 3-4 days of DOE, orthopnea and "excess fluid in his belly".  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  Patient is a difficult historian.  He states that he was hospitalized last year in [place], [place] for similar symptoms, was started on Lasix but discontinued because of low potassium.  He reports he has been having SOB when he lays  down at night and with exertion for the past 3-4 days.  He feels better when he sits up.  He also admits to having some dry cough only in the evenings from "allergies, air I breath in" but doesn't go on all night.  Denies chest pain/discomfort, leg edema, fever, chills, nausea, vomiting or abdominal pain.  He has not had any cough during the entire stay at [facility] ED.  On arrival to ED, BP 195/138, BP came down to 135/90 after Labetalol totalling 60 mg iv and Clonidine 0.2.     BNP 679.2, Troponin 0.03, repeat 0.03. Bun/Cr 14/1.43, CXR Bilateral pulmonary opacities favoring multifocal pneumonia.  Patient states he has been taking Lisinopril 10 mg daily but does not monitor his BP.  He also initially reported that he uses his CPAP daily but later on admitted hasn't used it for 6 or  so days.   While BP was elevated, I asked Dr. Allyson Sabal to see if we can bring down his BP with IV/oral meds, otherwise will need to have gtt and transfer to [facility] since we have no step down beds.  Review of Systems:  ROS  All systems were reviewed and are negative unless pertinent positive stated in HPI.  CONSTITUTIONAL: No night sweats. No fatigue. No fever or chills.   Eyes: No visual changes. No eye pain.   ENT: No runny nose. No epistaxis.   CARDIOVASCULAR: No chest pains. No palpitations.   GASTROINTESTINAL: No abdominal pain. No vomiting. No diarrhea or constipation.   GENITOURINARY: No urgency. No frequency. No dysuria.   MUSCULOSKELETAL: No musculoskeletal pain. No joint swelling.   NEUROLOGICAL: No headache or neck pain. No syncope or seizure.   PSYCHIATRIC: No depression, no psychosis.  SKIN: No rashes. No lesions. No petechiae.  ENDOCRINE: No unexplained weight loss. No polydipsia.   HEMATOLOGIC: No anemia. No purpura. No bleeding.   ALLERGIC AND IMMUNOLOGIC: No pruritus. No swelling      Objective   Physical Exam:     Vitals: T:97.6 F (36.4 C),  BP:(!) 134/93, HR:79, RR:(!) 23, SaO2:95%  Physical Exam  1) General Appearance: morbidly  obese, Alert and oriented x 4. In no acute distress at resst.   2) Eyes: Pink conjunctiva, anicteric sclera. Pupils are equally reactive to light.  3) ENT: Oral mucosa moist with no pharyngeal congestion, erythema or swelling.  4) Neck: Supple, with full range of motion. Trachea is central, no JVD noted  5) Chest: diminished breath sounds at bases, no wheezes or rhonchi, no labored breathing at rest  5) CVS: normal rate and regular rhythm, with no murmurs.  6) Abdomen: Soft, non-tender, no palpable mass. Bowel sounds normal. No CVA tenderness  7) Extremities: No pitting edema, pulses palpable, no calf swelling and gross no deformity.  8) Skin: Warm, dry with normal skin turgor, no rash   9) Neurological: Cranial nerves II-XII intact. No gross focal motor or sensory deficits noted.  10) Psychiatric: Affect is appropriate. No hallucinations.  Patient Vitals for the past 12 hrs:   BP Temp Pulse Resp   07/09/17 0512 (!) 134/93 - 79 (!) 23   07/09/17 0458 (!) 133/93 - 78 (!) 23   07/09/17 0443 130/89 - 81 21   07/09/17 0437 (!) 138/95 - 80 (!) 24   07/09/17 0435 - - 76 (!) 23   07/09/17 0427 135/90 - 79 (!) 25   07/09/17 0414 (!) 133/107 - 80 (!) 23   07/09/17 0358 (!) 165/116 - 79 20   07/09/17 0353 - - 79 18   07/09/17 0342 (!) 150/112 - 81 21   07/09/17 0327 - - 81 (!) 26   07/09/17 0325 (!) 156/111 - 80 -   07/09/17 0323 (!) 164/115 - 81 (!) 32   07/09/17 0318 (!) 173/127 - 83 17   07/09/17 0314 (!) 179/131 - 80 -   07/09/17 0259 (!) 176/132 - 85 21   07/09/17 0245 (!) 172/128 - 83 (!) 30   07/09/17 0230 (!) 168/122 - 85 21   07/09/17 0229 (!) 168/122 - 85 -   07/09/17 0225 (!) 173/123 - 87 (!) 26   07/09/17 0220 (!) 190/139 - (!) 101 (!) 23   07/09/17 0141 (!) 183/132 - 98 (!) 28   07/09/17 0133 - - 97 19   07/09/17 0106 (!) 195/138 97.6 F (36.4 C) (!)  111 (!) 24     Weight Monitoring 07/09/2017   Height 170.2 cm   Height Method Stated   Weight 149.687 kg   Weight Method Stated   BMI (calculated) 51.8 kg/m2        EKG: sinus tach pulse 105   Recent Results (from the past 24 hour(s))   ECG 12 lead    Collection Time: 07/09/17  1:11 AM   Result Value Ref Range    Patient Age 32 years    Patient DOB 04/20/1985     Patient Height      Patient Weight      Interpretation Text       Sinus tachycardia  Probable left atrial enlargement  Abnormal T, consider ischemia, lateral leads  No previous ECG available for comparison      Physician Interpreter      Ventricular Rate 105 //min    QRS Duration 93 ms    P-R Interval 167 ms    Q-T Interval 355 ms    Q-T Interval(Corrected) 470 ms    P Wave Axis 30 deg    QRS Axis 4 deg    T Axis 149 years   CBC and differential    Collection Time: 07/09/17  1:20 AM   Result Value Ref Range    WBC 11.6 (H) 4.0 - 11.0 K/cmm    RBC 4.78 4.00 - 5.70 M/cmm    Hemoglobin 12.3 (L) 13.0 - 17.5 gm/dL    Hematocrit 16.1 (L) 39.0 - 52.5 %    MCV 79 (L) 80 - 100 fL    MCH 26 (L) 28 - 35 pg    MCHC 32 31 - 36 gm/dL    RDW 09.6 (H) 04.5 - 14.5 %    PLT CT 330 130 - 440 K/cmm    MPV 6.4 6.0 - 10.0 fL    NEUTROPHIL % 54.2 42.0 - 78.0 %    Lymphocytes 37.1 15.0 - 46.0 %    Monocytes 4.9 3.0 - 15.0 %    Eosinophils % 2.9 0.0 - 7.0 %    Basophils % 0.9 0.0 - 3.0 %    Neutrophils Absolute 6.3 1.7 - 8.6 K/cmm    Lymphocytes Absolute 4.3 0.6 - 5.1 K/cmm    Monocytes Absolute 0.6 0.1 - 1.7 K/cmm    Eosinophils Absolute 0.3 0.0 - 0.8 K/cmm    BASO Absolute 0.1 0.0 - 0.3 K/cmm   Comprehensive metabolic panel    Collection Time: 07/09/17  1:20 AM   Result Value Ref Range    Sodium 143 136 - 147 mMol/L    Potassium 3.5 3.5 - 5.3 mMol/L    Chloride 106 98 - 110 mMol/L    CO2 26.3 20.0 - 30.0 mMol/L    Calcium 8.7 8.5 - 10.5 mg/dL    Glucose 98 71 - 99 mg/dL    Creatinine 4.09 (H) 0.80 - 1.30 mg/dL    BUN 14 7 - 22 mg/dL    Protein, Total 7.3 6.0 - 8.3 gm/dL    Albumin 3.4 (L) 3.5 - 5.0 gm/dL    Alkaline Phosphatase 89 40 - 145 U/L    ALT 32 0 - 55 U/L    AST (SGOT) 46 (H) 10 - 42 U/L    Bilirubin, Total 0.9 0.1 - 1.2 mg/dL     Albumin/Globulin Ratio 0.89 0.80 - 2.00 Ratio    Anion Gap 14.4 7.0 - 18.0 mMol/L    BUN/Creatinine Ratio 9.8 (L) 10.0 -  30.0 Ratio    EGFR 73 60 - 150 mL/min/1.76m2    Osmolality Calc 285 275 - 300 mOsm/kg    Globulin 3.9 2.0 - 4.0 gm/dL   B-type Natriuretic Peptide    Collection Time: 07/09/17  1:20 AM   Result Value Ref Range    B-Natriuretic Peptide 679.2 (H) 0.0 - 100.0 pg/mL   Troponin I (Stat)    Collection Time: 07/09/17  1:20 AM   Result Value Ref Range    Troponin I 0.03 (H) 0.00 - 0.02 ng/mL   D-dimer, quantitative    Collection Time: 07/09/17  1:20 AM   Result Value Ref Range    D-Dimer 0.37 0.19 - 0.52 mg/L FEU   Troponin I (Stat)    Collection Time: 07/09/17  4:35 AM   Result Value Ref Range    Troponin I 0.03 (H) 0.00 - 0.02 ng/mL        Past Medical History:   Diagnosis Date   . Hypertension    . Sleep apnea       History reviewed. No pertinent surgical history.   History reviewed. No pertinent family history.   Social History   Substance Use Topics   . Smoking status: Never Smoker   . Smokeless tobacco: Never Used   . Alcohol use No      Comment: rarely      No Known Allergies   Xr Chest 2 Views    Result Date: 07/09/2017  1. Bilateral pulmonary opacities favoring multifocal pneumonia. 2. Cardiac silhouette at the upper limits of normal in size. ReadingStation:[id number]     Home Medications     Med List Status:  In Progress Set By: Marcy Panning, RN at 07/09/2017  1:13 AM                lisinopril (PRINIVIL,ZESTRIL) 10 MG tablet     Take 10 mg by mouth daily     potassium chloride (K-DUR,KLOR-CON) 20 MEQ tablet     Take 20 mEq by mouth         Meds given in the ED:  Medications   hydrALAZINE (APRESOLINE) injection 10 mg (10 mg Intravenous Not Given 07/09/17 0430)   labetalol (NORMODYNE,TRANDATE) injection 20 mg (20 mg Intravenous Given 07/09/17 0220)   cloNIDine (CATAPRES) tablet 0.2 mg (0.2 mg Oral Given 07/09/17 0229)   labetalol (NORMODYNE,TRANDATE) injection 40 mg (40 mg Intravenous Given  07/09/17 0311)   furosemide (LASIX) injection 40 mg (40 mg Intravenous Given 07/09/17 0540)         Jacqulynn Cadet, MD     07/09/17,5:40 AM   MRN: [id number]                                      CSN: [id number] DOB: [birth year]

## 2017-07-09 NOTE — Progress Notes (Signed)
Authorization for Use of Protected Health Information sheet faxed to [facility] Medical Record's Department.  Awaiting pt's medical records to be faxed.

## 2017-07-09 NOTE — Progress Notes (Signed)
Received report from Alexis, RN and assumed care of patient.

## 2017-07-10 LAB — ECG 12-LEAD
P Wave Axis: 30 deg
P Wave Axis: 56 deg
P-R Interval: 167 ms
P-R Interval: 173 ms
Patient Age: 32 years
Patient Age: 32 years
Q-T Interval(Corrected): 470 ms
Q-T Interval(Corrected): 513 ms
Q-T Interval: 355 ms
Q-T Interval: 431 ms
QRS Axis: 4 deg
QRS Axis: 55 deg
QRS Duration: 93 ms
QRS Duration: 98 ms
T Axis: 149 years
T Axis: 192 years
Ventricular Rate: 105 //min
Ventricular Rate: 85 //min
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
